# Patient Record
Sex: Female | Born: 1955 | Race: White | Hispanic: No | Marital: Married | Smoking: Never smoker
Health system: Southern US, Community
[De-identification: ages and names within clinical notes are randomized; demographics above are authoritative.]

---

## 2003-08-28 ENCOUNTER — Other Ambulatory Visit: Admission: RE | Admit: 2003-08-28 | Discharge: 2003-08-28 | Payer: Self-pay | Admitting: Obstetrics and Gynecology

## 2006-03-19 ENCOUNTER — Other Ambulatory Visit: Admission: RE | Admit: 2006-03-19 | Discharge: 2006-03-19 | Payer: Self-pay | Admitting: Family Medicine

## 2008-10-06 ENCOUNTER — Encounter: Admission: RE | Admit: 2008-10-06 | Discharge: 2008-10-06 | Payer: Self-pay | Admitting: Family Medicine

## 2008-12-07 ENCOUNTER — Ambulatory Visit (HOSPITAL_COMMUNITY): Admission: RE | Admit: 2008-12-07 | Discharge: 2008-12-07 | Payer: Self-pay | Admitting: Surgery

## 2008-12-07 ENCOUNTER — Encounter (INDEPENDENT_AMBULATORY_CARE_PROVIDER_SITE_OTHER): Payer: Self-pay | Admitting: Surgery

## 2009-05-09 ENCOUNTER — Other Ambulatory Visit: Admission: RE | Admit: 2009-05-09 | Discharge: 2009-05-09 | Payer: Self-pay | Admitting: Family Medicine

## 2010-06-28 LAB — CBC
HCT: 39.9 % (ref 36.0–46.0)
Hemoglobin: 13.4 g/dL (ref 12.0–15.0)
MCV: 98.8 fL (ref 78.0–100.0)
Platelets: 223 10*3/uL (ref 150–400)
RDW: 13 % (ref 11.5–15.5)

## 2010-06-28 LAB — DIFFERENTIAL
Basophils Absolute: 0 10*3/uL (ref 0.0–0.1)
Basophils Relative: 0 % (ref 0–1)
Lymphocytes Relative: 32 % (ref 12–46)
Neutro Abs: 2.9 10*3/uL (ref 1.7–7.7)
Neutrophils Relative %: 59 % (ref 43–77)

## 2010-06-28 LAB — COMPREHENSIVE METABOLIC PANEL
Alkaline Phosphatase: 39 U/L (ref 39–117)
BUN: 7 mg/dL (ref 6–23)
Chloride: 106 mEq/L (ref 96–112)
Creatinine, Ser: 0.61 mg/dL (ref 0.4–1.2)
Glucose, Bld: 82 mg/dL (ref 70–99)
Potassium: 4.6 mEq/L (ref 3.5–5.1)
Total Bilirubin: 0.5 mg/dL (ref 0.3–1.2)
Total Protein: 6.7 g/dL (ref 6.0–8.3)

## 2011-04-06 IMAGING — US US ABDOMEN COMPLETE
1 series · 13 of 25 positions shown · non-contrast
Comparison: None.

CLINICAL DATA: 52-year-old female with right upper quadrant and
epigastric abdominal pain.

COMPLETE ABDOMINAL ULTRASOUND

[Series 1: us abdomen complete · 0.22mm/px · 13 of 93 slices shown]
[im 1/93]
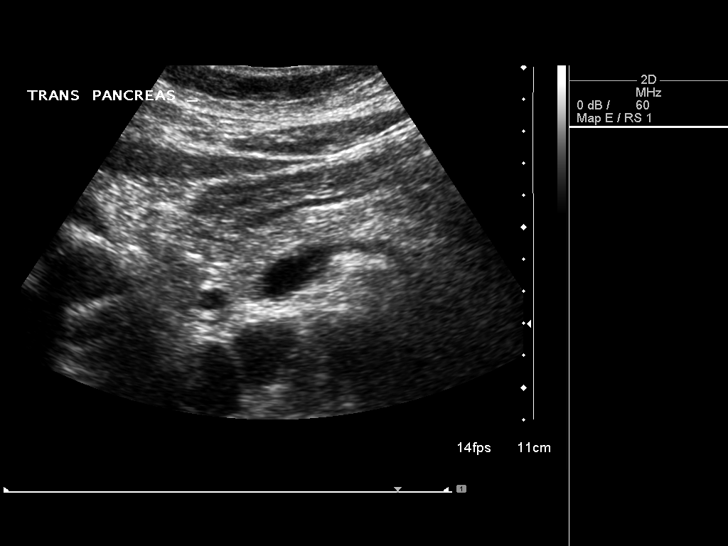
[im 8/93]
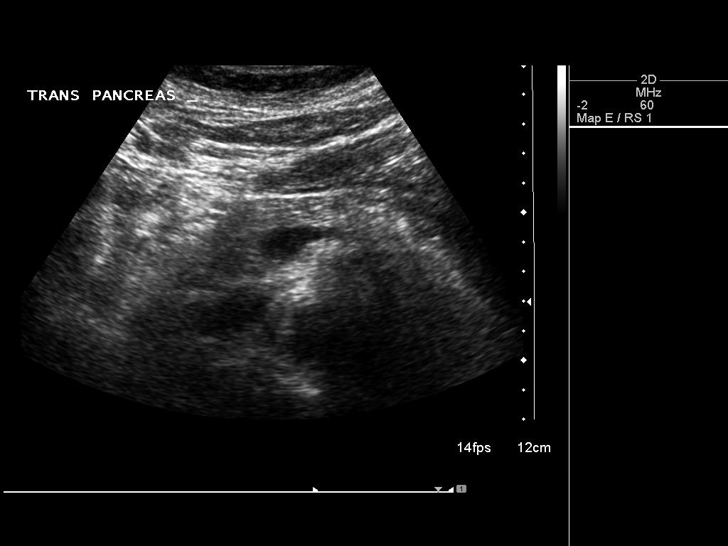
[im 16/93]
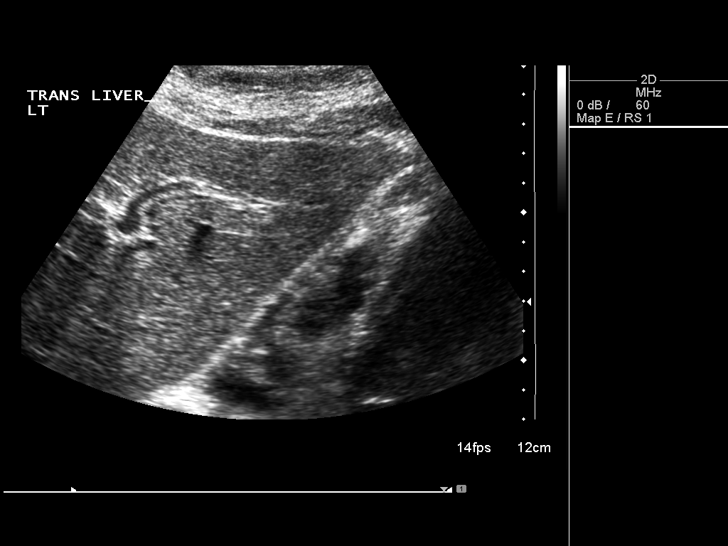
[im 24/93]
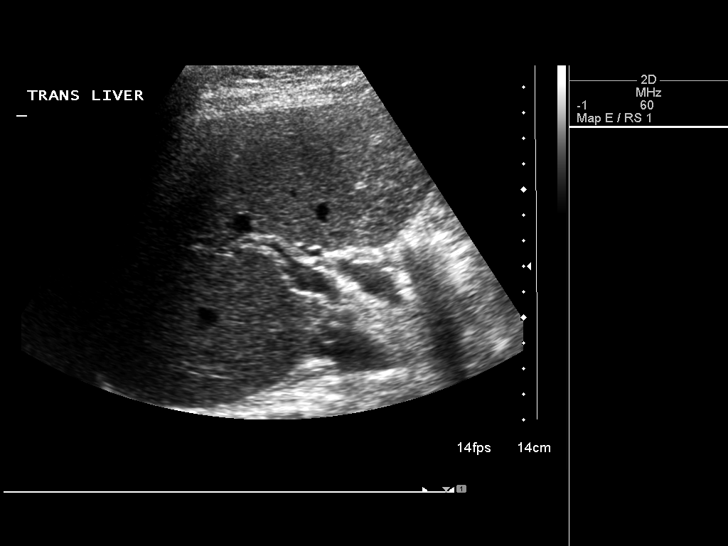
[im 31/93]
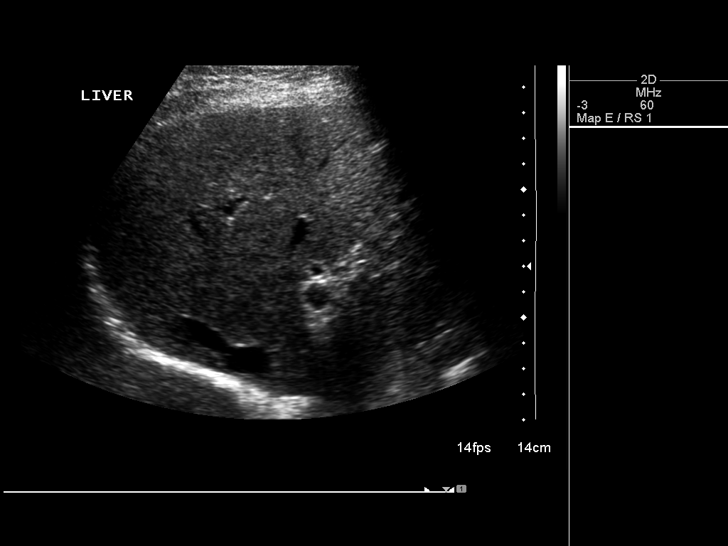
[im 39/93]
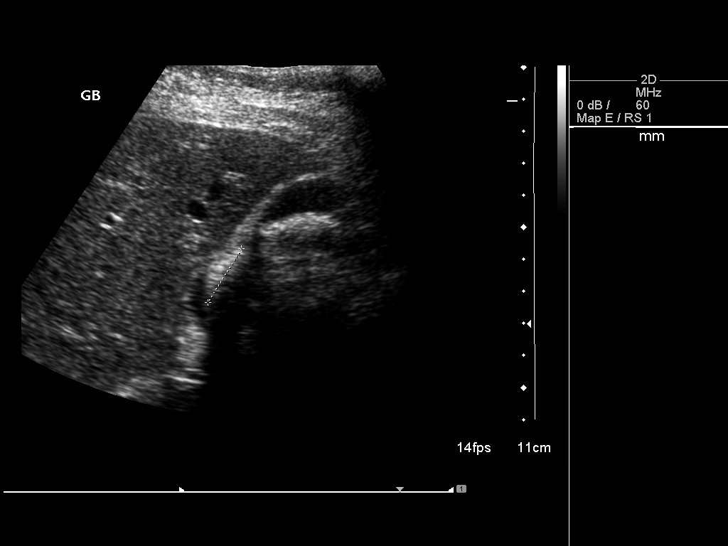
[im 47/93]
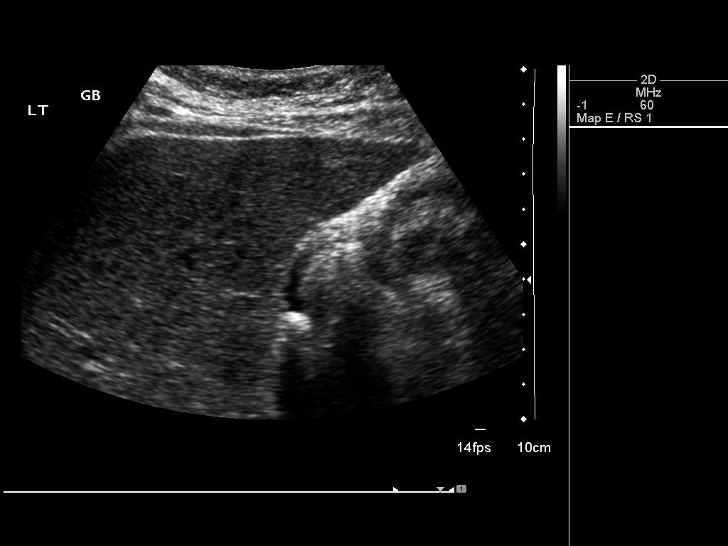
[im 54/93]
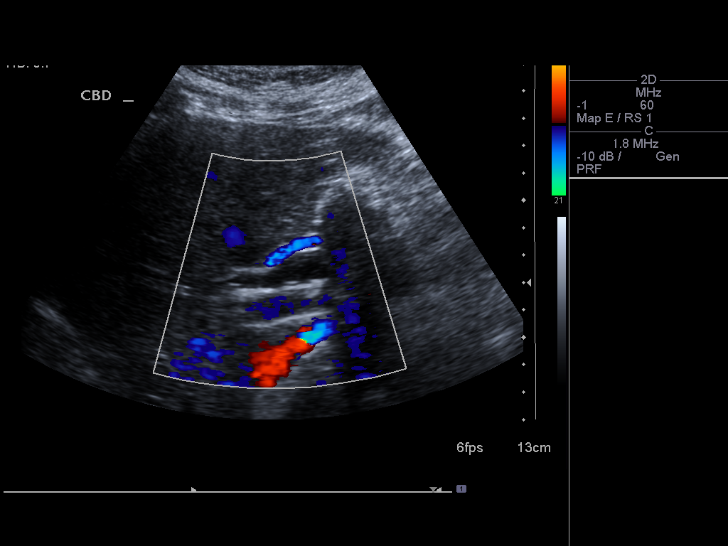
[im 62/93]
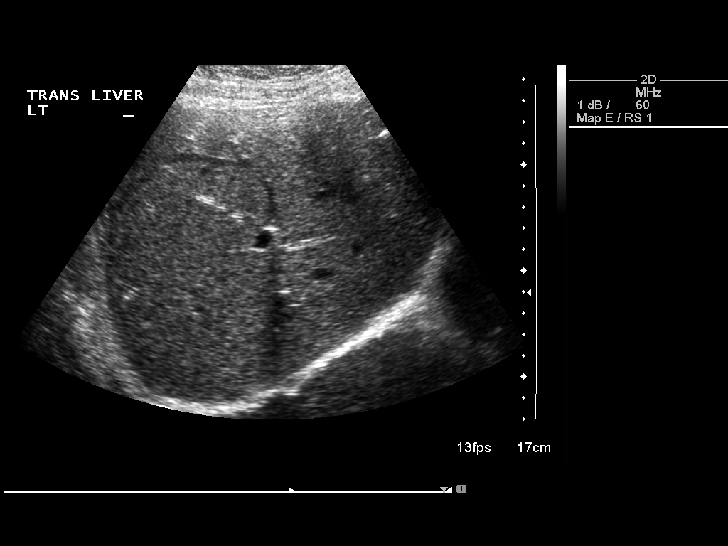
[im 70/93]
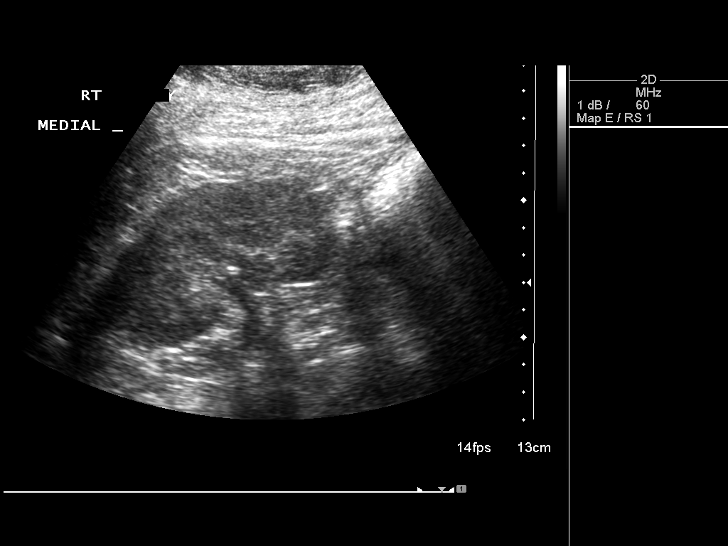
[im 77/93]
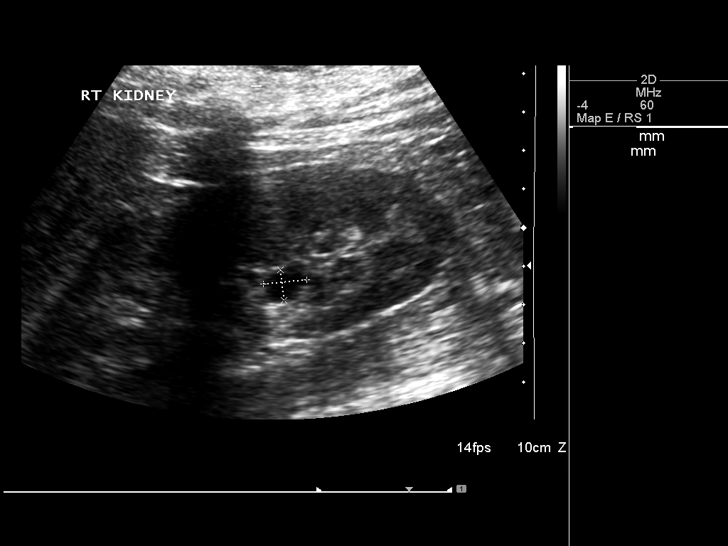
[im 85/93]
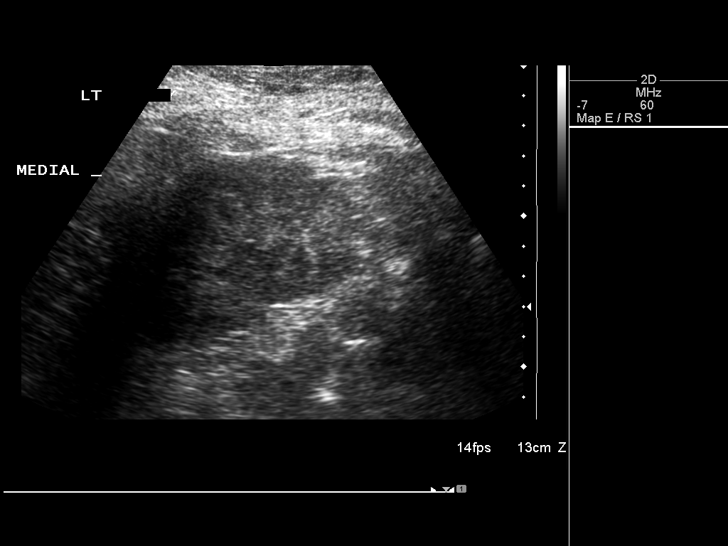
[im 93/93]
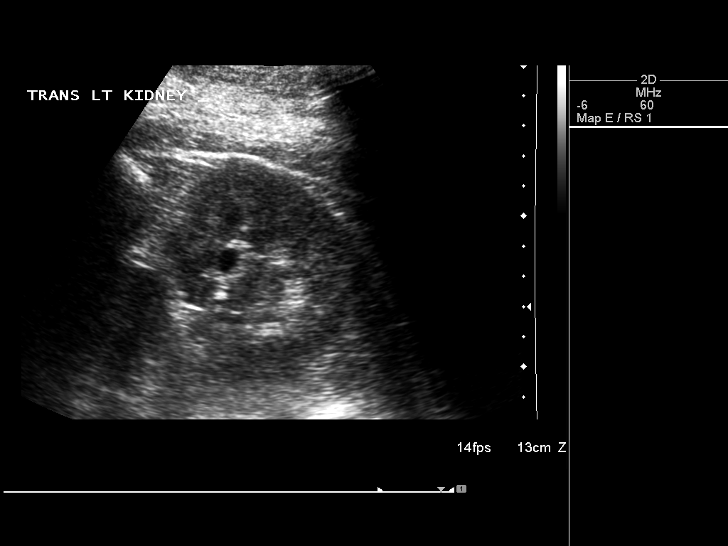

[13 of 25 positions shown; findings below may reference images not displayed]

FINDINGS: Gallbladder:  Multiple large shadowing gallstones measuring up to
approximately 29 mm in diameter.  Gallbladder wall thickness within
normal limits, 2-3 mm.  No sonographic Murphy's sign elicited.  No
pericholecystic fluid.

Common bile duct:  A segment of mild dilatation are noted, but the
duct does not appear universally dilated.  Ductal diameter of 5-10
mm is demonstrated.

Liver:  No intrahepatic biliary ductal dilatation.  Negative
sonographic appearance of the liver aside from occasional small
calcified granulomas.

IVC:  Negative.

Pancreas:  Negative.

Spleen:  Negative measuring 5.7 cm in length.

Right Kidney:  Negative aside from a small parapelvic cysts.  Right
renal length 11.8 cm.

Left Kidney:  Negative aside from small parapelvic and lower pole
renal cysts measuring up to 23 x 16 x 19 mm.  Left renal length
12.7 cm.

Abdominal aorta:  Negative with visualized maximal diameter 2.5 cm.
IMPRESSION: 1.  Cholelithiasis.  Clinical correlation for biliary colic
recommended.
2.  Questionable mild dilatation of the common bile duct without
intrahepatic biliary dilatation.  Correlation for
hyperbilirubinemia may be helpful.
3.  Benign appearing bilateral renal cysts.

I discussed the major findings in this case with Dr. Lakhedar by
telephone at 4444 hours on 10/06/2008.

## 2011-06-07 IMAGING — RF DG CHOLANGIOGRAM OPERATIVE
1 series · 4 of 4 positions shown · non-contrast
Comparison: None

CLINICAL DATA: Cholelithiasis

INTRAOPERATIVE CHOLANGIOGRAM
TECHNIQUE: Cholangiographic images from the C-arm fluoroscopic
device were submitted for interpretation post-operatively.  Please
see the procedural report for the amount of contrast and the
fluoroscopy time utilized.

[Series 1: run · 4 of 144 frames shown]
[frame 14/144]
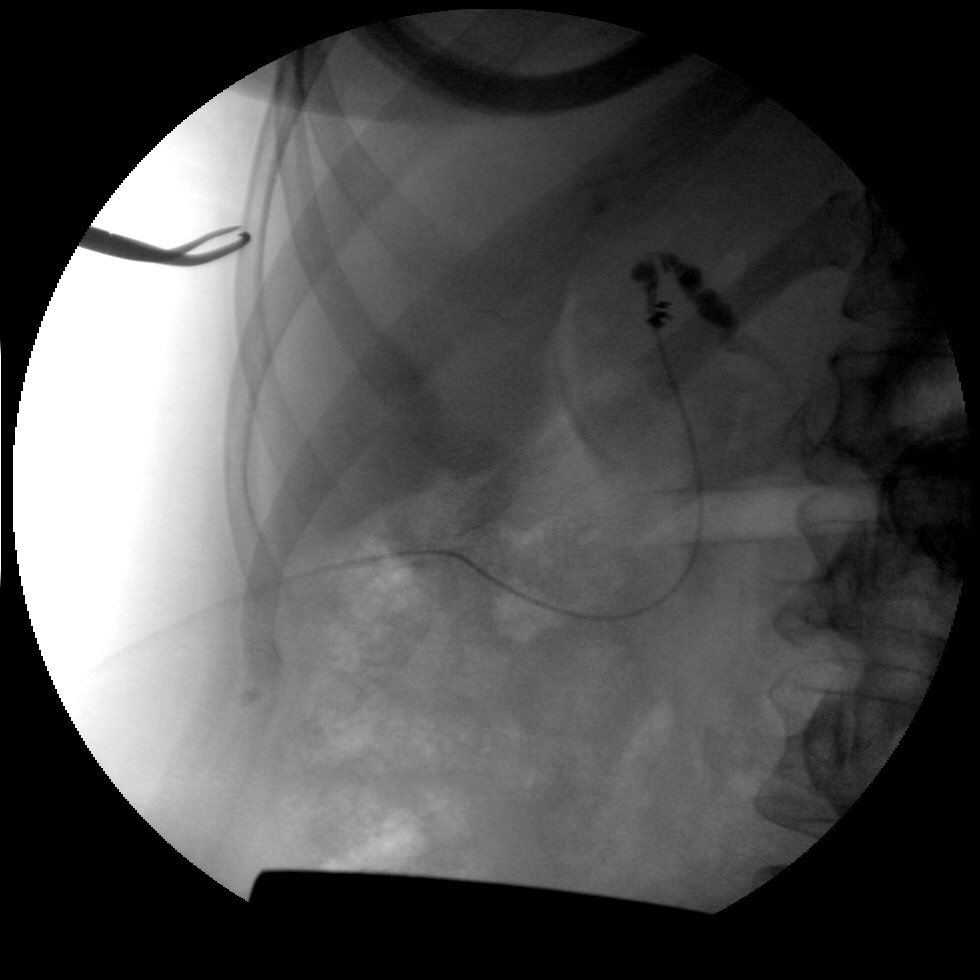
[frame 22/144]
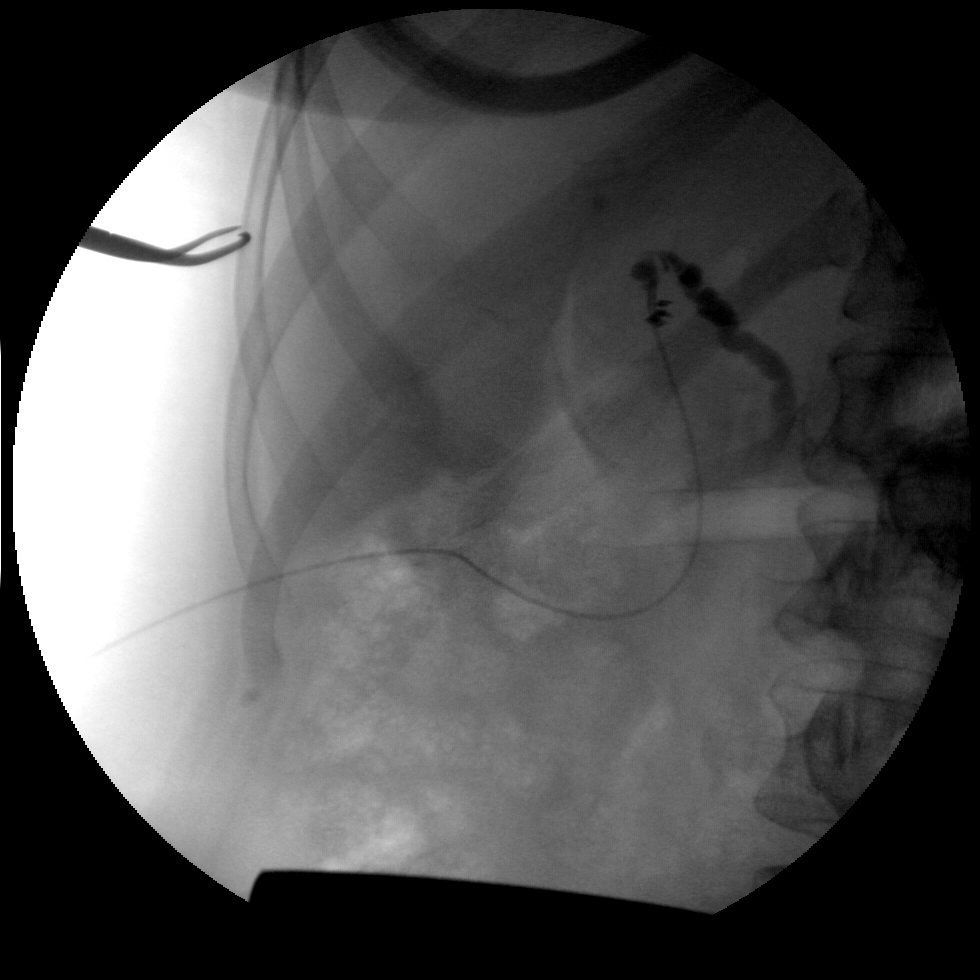
[frame 73/144]
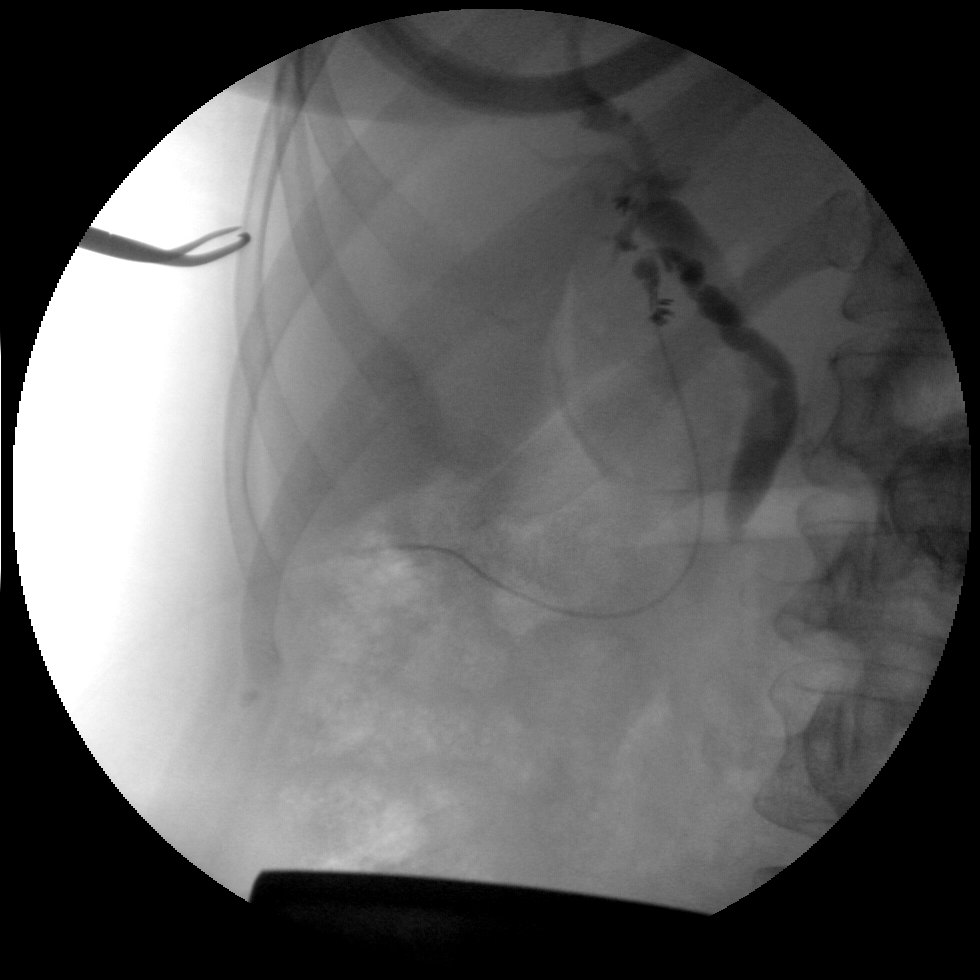
[frame 123/144]
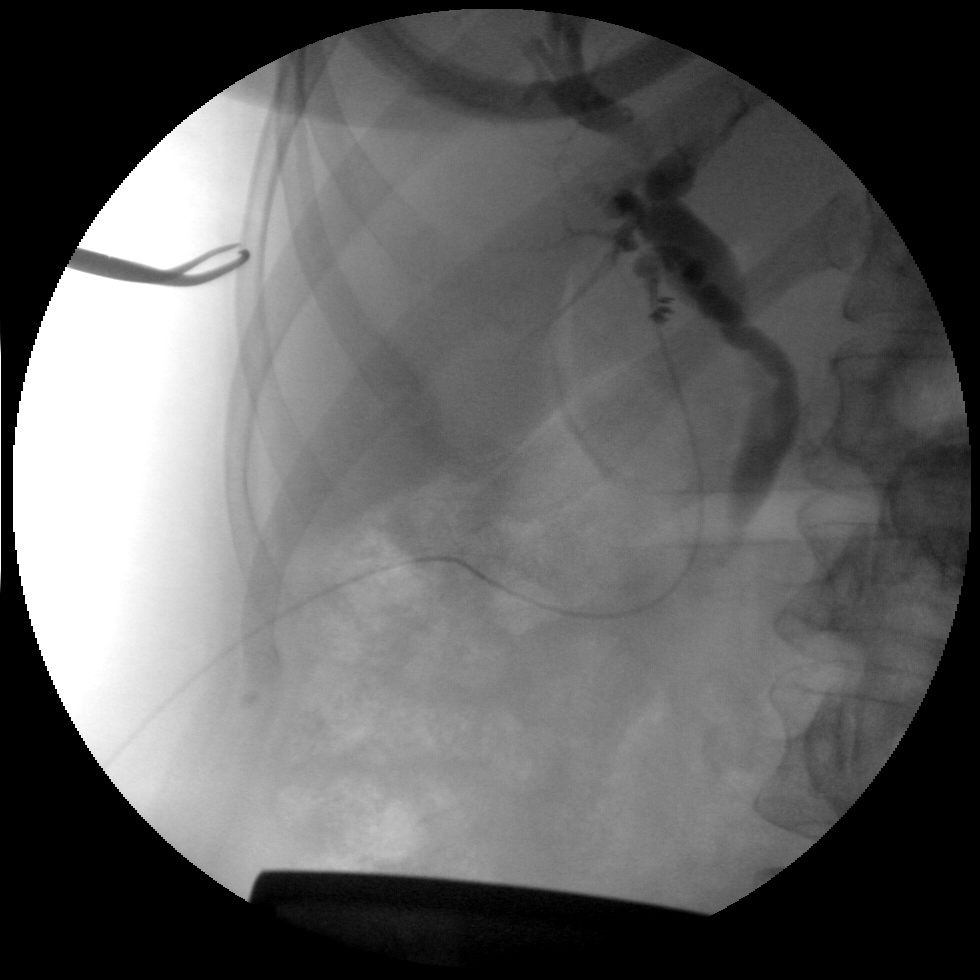

[4 of 4 positions shown; findings below may reference images not displayed]

FINDINGS: Intrahepatic ducts are incompletely visualized,
appearing decompressed centrally. No definite intraluminal filling
defect is identified.  However, contrast is not documented beyond
the ampulla into the duodenum, and I cannot entirely exclude a
distal obstructing calculus.

IMPRESSION

Possible distal obstructing common duct stone.

## 2012-05-27 ENCOUNTER — Other Ambulatory Visit: Payer: Self-pay | Admitting: Family Medicine

## 2012-05-27 ENCOUNTER — Other Ambulatory Visit (HOSPITAL_COMMUNITY)
Admission: RE | Admit: 2012-05-27 | Discharge: 2012-05-27 | Disposition: A | Discharge status: Home / Self Care | Payer: BC Managed Care – PPO | Source: Ambulatory Visit | Attending: Family Medicine | Admitting: Family Medicine

## 2012-05-27 DIAGNOSIS — Z124 Encounter for screening for malignant neoplasm of cervix: Secondary | ICD-10-CM | POA: Insufficient documentation

## 2015-06-26 ENCOUNTER — Other Ambulatory Visit (HOSPITAL_COMMUNITY)
Admission: RE | Admit: 2015-06-26 | Discharge: 2015-06-26 | Disposition: A | Discharge status: Home / Self Care | Source: Ambulatory Visit | Attending: Family Medicine | Admitting: Family Medicine

## 2015-06-26 ENCOUNTER — Other Ambulatory Visit: Payer: Self-pay | Admitting: Family Medicine

## 2015-06-26 DIAGNOSIS — Z124 Encounter for screening for malignant neoplasm of cervix: Secondary | ICD-10-CM | POA: Diagnosis not present

## 2015-06-27 LAB — CYTOLOGY - PAP

## 2019-08-29 ENCOUNTER — Other Ambulatory Visit (HOSPITAL_COMMUNITY)
Admission: RE | Admit: 2019-08-29 | Discharge: 2019-08-29 | Disposition: A | Discharge status: Home / Self Care | Source: Ambulatory Visit | Attending: Family Medicine | Admitting: Family Medicine

## 2019-08-29 ENCOUNTER — Other Ambulatory Visit: Payer: Self-pay | Admitting: Family Medicine

## 2019-08-29 DIAGNOSIS — Z124 Encounter for screening for malignant neoplasm of cervix: Secondary | ICD-10-CM | POA: Insufficient documentation

## 2019-08-31 LAB — CYTOLOGY - PAP: Diagnosis: NEGATIVE

## 2021-11-05 ENCOUNTER — Other Ambulatory Visit (HOSPITAL_BASED_OUTPATIENT_CLINIC_OR_DEPARTMENT_OTHER): Payer: Self-pay | Admitting: Family Medicine

## 2021-11-05 ENCOUNTER — Other Ambulatory Visit: Payer: Self-pay | Admitting: Family Medicine

## 2021-11-05 DIAGNOSIS — E7800 Pure hypercholesterolemia, unspecified: Secondary | ICD-10-CM

## 2021-11-15 ENCOUNTER — Ambulatory Visit (HOSPITAL_BASED_OUTPATIENT_CLINIC_OR_DEPARTMENT_OTHER)
Admission: RE | Admit: 2021-11-15 | Discharge: 2021-11-15 | Disposition: A | Discharge status: Home / Self Care | Payer: TRICARE For Life (TFL) | Source: Ambulatory Visit | Attending: Family Medicine | Admitting: Family Medicine

## 2021-11-15 DIAGNOSIS — E7800 Pure hypercholesterolemia, unspecified: Secondary | ICD-10-CM | POA: Insufficient documentation

## 2021-11-20 ENCOUNTER — Telehealth: Payer: Self-pay

## 2021-11-20 NOTE — Telephone Encounter (Signed)
NOTES SCANNED TO REFERRAL 

## 2021-11-21 NOTE — Progress Notes (Signed)
Cardiology Office Note:    Date:  11/22/2021   ID:  Evelyn Ponce, DOB Dec 26, 1955, MRN [id number]  PCP:  Daisy Floro, MD  Cardiologist:  None  Electrophysiologist:  None   Referring MD: Daisy Floro, MD   Chief Complaint  Patient presents with   Coronary Artery Disease    History of Present Illness:    Evelyn Ponce is a 66 y.o. female with hyperlipidemia who is referred by Dr Tenny Craw for evaluation of CAD.  Denies any chest pain, dyspnea, lightheadedness, syncope, or palpitations.  Reports occasional lower extremity edema.  States that she is active, works out on elliptical every day for 30 minutes, denies any exertional symptoms.  No smoking history.  Family history includes father had stroke at 74 and maternal grandfather died of MI at 9.  Calcium score on 11/15/2021 was 339 (93rd percentile).  Also with ascending aortic dilatation measuring 43 mm and small pericardial effusion.  No past medical history on file.    Current Medications: Current Meds  Medication Sig   aspirin EC 81 MG tablet Take 81 mg by mouth daily. Swallow whole.   rosuvastatin (CRESTOR) 10 MG tablet Take 1 tablet (10 mg total) by mouth daily.     Allergies:   Percocet [oxycodone-acetaminophen]   Social History   Socioeconomic History   Marital status: Married    Spouse name: Not on file   Number of children: Not on file   Years of education: Not on file   Highest education level: Not on file  Occupational History   Not on file  Tobacco Use   Smoking status: Never   Smokeless tobacco: Never  Substance and Sexual Activity   Alcohol use: Not on file   Drug use: Not on file   Sexual activity: Not on file  Other Topics Concern   Not on file  Social History Narrative   Not on file   Social Determinants of Health   Financial Resource Strain: Not on file  Food Insecurity: Not on file  Transportation Needs: Not on file  Physical Activity: Not on file  Stress: Not on file  Social  Connections: Not on file     Family History: Family history includes father had stroke at 69 and maternal grandfather died of MI at 19.  ROS:   Please see the history of present illness.     All other systems reviewed and are negative.  EKGs/Labs/Other Studies Reviewed:    The following studies were reviewed today:   EKG:   11/22/2021: Normal sinus rhythm, rate 70, no ST abnormalities  Recent Labs: No results found for requested labs within last 365 days.  Recent Lipid Panel No results found for: "CHOL", "TRIG", "HDL", "CHOLHDL", "VLDL", "LDLCALC", "LDLDIRECT"  Physical Exam:    VS:  BP 128/81 (BP Location: Left Arm, Patient Position: Sitting, Cuff Size: Normal)   Pulse 70   Ht 5\' 8"  (1.727 m)   Wt 177 lb 3.2 oz (80.4 kg)   SpO2 94%   BMI 26.94 kg/m     Wt Readings from Last 3 Encounters:  11/22/21 177 lb 3.2 oz (80.4 kg)     GEN:  Well nourished, well developed in no acute distress HEENT: Normal NECK: No JVD; No carotid bruits LYMPHATICS: No lymphadenopathy CARDIAC: RRR, no murmurs, rubs, gallops RESPIRATORY:  Clear to auscultation without rales, wheezing or rhonchi  ABDOMEN: Soft, non-tender, non-distended MUSCULOSKELETAL:  No edema; No deformity  SKIN: Warm and dry NEUROLOGIC:  Alert and oriented x 3 PSYCHIATRIC:  Normal affect   ASSESSMENT:    1. Coronary artery disease involving native coronary artery of native heart without angina pectoris   2. Hyperlipidemia, unspecified hyperlipidemia type   3. Aortic dilatation (HCC)   4. Pericardial effusion    PLAN:    CAD: Calcium score on 11/15/2021 was 339 (93rd percentile).  No anginal symptoms -Continue aspirin 81 mg daily -Start rosuvastatin 10 mg daily  Hyperlipidemia: LDL 143 on 10/25/2021.  Start rosuvastatin 10 mg daily as above.  Check fasting lipid panel in 2 months  Dilated ascending aorta: Measured 43 mm on calcium score.  Recommend CTA chest in 1 year to follow.  Check echocardiogram to evaluate for  aortic regurgitation  Pericardial effusion: Small effusion noted on calcium score.  Check echocardiogram   RTC in 6 months   Medication Adjustments/Labs and Tests Ordered: Current medicines are reviewed at length with the patient today.  Concerns regarding medicines are outlined above.  Orders Placed This Encounter  Procedures   CT ANGIO CHEST AORTA W/CM & OR WO/CM   Lipid panel   EKG 12-Lead   ECHOCARDIOGRAM COMPLETE   Meds ordered this encounter  Medications   rosuvastatin (CRESTOR) 10 MG tablet    Sig: Take 1 tablet (10 mg total) by mouth daily.    Dispense:  90 tablet    Refill:  3    Patient Instructions  Medication Instructions:  START rosuvastatin (Crestor) 10 mg daily  *If you need a refill on your cardiac medications before your next appointment, please call your pharmacy*   Lab Work: Please return for FASTING labs in 2 months (Lipid)  Our in office lab hours are Monday-Friday 8:00-4:00, closed for lunch 12:45-1:45 pm.  No appointment needed.  [facility] locations:   [facility] - [address] (Dr. Campbell Lerner office) - [address] ([facility]) - [address] [phone] N. [address]   [place] - [address]    [place]  - [address]    [place] - [address] - [address] [employer]  - [address] - [address] Chief Technology Officer)  Testing/Procedures: Your physician has requested that you have an echocardiogram. Echocardiography is a painless test that uses sound waves to create images of your heart. It provides your doctor with information about the size and shape of your heart and how well your heart's chambers and valves are working. This procedure takes approximately one hour. There are no restrictions for this procedure.  CTA chest/aorta in 12 months-we will schedule this closer to the time it is  due.  Follow-Up: At [facility], you and your health needs are our priority.  As part of our continuing mission to provide you with exceptional heart care, we have created designated Provider Care Teams.  These Care Teams include your primary Cardiologist (physician) and Advanced Practice Providers (APPs -  Physician Assistants and Nurse Practitioners) who all work together to provide you with the care you need, when you need it.  We recommend signing up for the patient portal called "MyChart".  Sign up information is provided on this After Visit Summary.  MyChart is used to connect with patients for Virtual Visits (Telemedicine).  Patients are able to view lab/test results, encounter notes, upcoming appointments, etc.  Non-urgent messages can be sent to your provider  as well.   To learn more about what you can do with MyChart, go to [email or web address].    Your next appointment:   6 month(s)  The format for your next appointment:   In Person  Provider:   Dr. Bjorn Pippin  Important Information About Sugar         Signed, Little Ishikawa, MD  11/22/2021 12:16 PM    [facility]

## 2021-11-22 ENCOUNTER — Ambulatory Visit: Payer: Medicare Other | Attending: Cardiology | Admitting: Cardiology

## 2021-11-22 ENCOUNTER — Encounter: Payer: Self-pay | Admitting: Cardiology

## 2021-11-22 VITALS — BP 128/81 | HR 70 | Ht 68.0 in | Wt 177.2 lb

## 2021-11-22 DIAGNOSIS — I2510 Atherosclerotic heart disease of native coronary artery without angina pectoris: Secondary | ICD-10-CM | POA: Diagnosis not present

## 2021-11-22 DIAGNOSIS — I77819 Aortic ectasia, unspecified site: Secondary | ICD-10-CM | POA: Diagnosis not present

## 2021-11-22 DIAGNOSIS — I3139 Other pericardial effusion (noninflammatory): Secondary | ICD-10-CM | POA: Insufficient documentation

## 2021-11-22 DIAGNOSIS — E785 Hyperlipidemia, unspecified: Secondary | ICD-10-CM | POA: Insufficient documentation

## 2021-11-22 MED ORDER — ROSUVASTATIN CALCIUM 10 MG PO TABS: 10.0000 mg | ORAL_TABLET | Freq: Every day | ORAL | 3 refills | Status: DC

## 2021-11-22 NOTE — Patient Instructions (Signed)
Medication Instructions:  START rosuvastatin (Crestor) 10 mg daily  *If you need a refill on your cardiac medications before your next appointment, please call your pharmacy*   Lab Work: Please return for FASTING labs in 2 months (Lipid)  Our in office lab hours are Monday-Friday 8:00-4:00, closed for lunch 12:45-1:45 pm.  No appointment needed.  [facility] locations:   [facility] - [address] (Dr. Campbell Lerner office) - [address] ([facility]) - [address] [phone] N. [address]    - [address]    [place]  - [address]    [place] - [address] - [address] [employer]  - [address] - [address] Chief Technology Officer)  Testing/Procedures: Your physician has requested that you have an echocardiogram. Echocardiography is a painless test that uses sound waves to create images of your heart. It provides your doctor with information about the size and shape of your heart and how well your heart's chambers and valves are working. This procedure takes approximately one hour. There are no restrictions for this procedure.  CTA chest/aorta in 12 months-we will schedule this closer to the time it is due.  Follow-Up: At [facility], you and your health needs are our priority.  As part of our continuing mission to provide you with exceptional heart care, we have created designated Provider Care Teams.  These Care Teams include your primary Cardiologist (physician) and Advanced Practice Providers (APPs -  Physician Assistants and Nurse Practitioners) who all work together to provide you with the care you need, when you need it.  We recommend signing up for the patient portal called "MyChart".  Sign up information is provided on this After Visit Summary.  MyChart is used to connect with patients for Virtual Visits  (Telemedicine).  Patients are able to view lab/test results, encounter notes, upcoming appointments, etc.  Non-urgent messages can be sent to your provider as well.   To learn more about what you can do with MyChart, go to [email or web address].    Your next appointment:   6 month(s)  The format for your next appointment:   In Person  Provider:   Dr. Bjorn Pippin  Important Information About Sugar

## 2021-12-05 ENCOUNTER — Ambulatory Visit (HOSPITAL_COMMUNITY): Payer: Medicare Other | Attending: Cardiology

## 2021-12-05 DIAGNOSIS — I3139 Other pericardial effusion (noninflammatory): Secondary | ICD-10-CM | POA: Diagnosis present

## 2021-12-05 DIAGNOSIS — I77819 Aortic ectasia, unspecified site: Secondary | ICD-10-CM

## 2021-12-05 LAB — ECHOCARDIOGRAM COMPLETE
Area-P 1/2: 3.66 cm2
S' Lateral: 3 cm

## 2022-01-31 LAB — LIPID PANEL
Chol/HDL Ratio: 2.4 ratio (ref 0.0–4.4)
Cholesterol, Total: 132 mg/dL (ref 100–199)
HDL: 56 mg/dL (ref >39)
LDL Chol Calc (NIH): 63 mg/dL (ref 0–99)
Triglycerides: 64 mg/dL (ref 0–149)
VLDL Cholesterol Cal: 13 mg/dL (ref 5–40)

## 2022-05-20 NOTE — Progress Notes (Unsigned)
Cardiology Office Note:    Date:  05/21/2022   ID:  [name], [name] May 07, 1955, MRN [id number]  PCP:  Lawerance Cruel, MD  Cardiologist:  None  Electrophysiologist:  None   Referring MD: Lawerance Cruel, MD   Chief Complaint  Patient presents with   Coronary Artery Disease    History of Present Illness:    Evelyn Ponce is a 67 y.o. female with hyperlipidemia who presents for follow-up.  She was referred by Dr Harrington Challenger for evaluation of CAD, initially seen 11/22/2021.  Denies any chest pain, dyspnea, lightheadedness, syncope, or palpitations.  Reports occasional lower extremity edema.  States that she is active, works out on elliptical every day for 30 minutes, denies any exertional symptoms.  No smoking history.  Family history includes father had stroke at 81 and maternal grandfather died of MI at 6.  Calcium score on 11/15/2021 was 339 (93rd percentile).  Also with ascending aortic dilatation measuring 43 mm and small pericardial effusion.  Echocardiogram 12/05/2021 showed EF 60 to 123456, grade 1 diastolic dysfunction, normal RV function, mild mitral regurgitation, mild aortic regurgitation, dilated aortic root measuring 42 mm, trivial pericardial effusion.  Since last clinic visit, she reports she is doing OK.  She walks 2 to 2.5 miles per day.  Reports she has been having heaviness in chest.  Reports she feels symptoms across her whole chest.  Has not noted relationship with exertion.  Reports some lightheadedness after she does a long bike ride but denies any syncope.  Reports intermittent lower extremity edema.   No past medical history on file.    Current Medications: Current Meds  Medication Sig   aspirin EC 81 MG tablet Take 81 mg by mouth daily. Swallow whole.   cycloSPORINE (RESTASIS) 0.05 % ophthalmic emulsion Place 1 drop into both eyes 2 (two) times daily.   rosuvastatin (CRESTOR) 10 MG tablet Take 1 tablet (10 mg total) by mouth daily.     Allergies:   Percocet  [oxycodone-acetaminophen]   Social History   Socioeconomic History   Marital status: Married    Spouse name: Not on file   Number of children: Not on file   Years of education: Not on file   Highest education level: Not on file  Occupational History   Not on file  Tobacco Use   Smoking status: Never   Smokeless tobacco: Never  Substance and Sexual Activity   Alcohol use: Not on file   Drug use: Not on file   Sexual activity: Not on file  Other Topics Concern   Not on file  Social History Narrative   Not on file   Social Determinants of Health   Financial Resource Strain: Not on file  Food Insecurity: Not on file  Transportation Needs: Not on file  Physical Activity: Not on file  Stress: Not on file  Social Connections: Not on file     Family History: Family history includes father had stroke at 77 and maternal grandfather died of MI at 54.  ROS:   Please see the history of present illness.     All other systems reviewed and are negative.  EKGs/Labs/Other Studies Reviewed:    The following studies were reviewed today:   EKG:   11/22/2021: Normal sinus rhythm, rate 70, no ST abnormalities 05/21/22: Normal sinus rhythm, rate 62, no ST abnormalities  Recent Labs: No results found for requested labs within last 365 days.  Recent Lipid Panel    Component Value  Date/Time   CHOL 132 01/31/2022 0817   TRIG 64 01/31/2022 0817   HDL 56 01/31/2022 0817   CHOLHDL 2.4 01/31/2022 0817   LDLCALC 63 01/31/2022 0817    Physical Exam:    VS:  BP 108/78   Pulse 62   Ht '5\' 8"'$  (1.727 m)   Wt 178 lb (80.7 kg)   BMI 27.06 kg/m     Wt Readings from Last 3 Encounters:  05/21/22 178 lb (80.7 kg)  11/22/21 177 lb 3.2 oz (80.4 kg)     GEN:  Well nourished, well developed in no acute distress HEENT: Normal NECK: No JVD; No carotid bruits LYMPHATICS: No lymphadenopathy CARDIAC: RRR, no murmurs, rubs, gallops RESPIRATORY:  Clear to auscultation without rales, wheezing or  rhonchi  ABDOMEN: Soft, non-tender, non-distended MUSCULOSKELETAL:  No edema; No deformity  SKIN: Warm and dry NEUROLOGIC:  Alert and oriented x 3 PSYCHIATRIC:  Normal affect   ASSESSMENT:    1. Coronary artery disease involving native coronary artery of native heart, unspecified whether angina present   2. Hyperlipidemia, unspecified hyperlipidemia type   3. Aortic dilatation (HCC)   4. Chest pain of uncertain etiology     PLAN:    CAD: Calcium score on 11/15/2021 was 339 (93rd percentile).  She is reporting atypical chest pain -Recommend stress PET to evaluate for ischemia -Continue aspirin 81 mg daily -Continue rosuvastatin 10 mg daily  Hyperlipidemia: LDL 143 on 10/25/2021.  Started rosuvastatin 10 mg daily, LDL improved to 63 on 01/31/2022  Dilated ascending aorta: Measured 43 mm on calcium score. 10/2021  Recommend CTA chest in 1 year to follow.  Mild AI on echo 11/2021.    Pericardial effusion: Small effusion noted on calcium score 10/2021.  Minimal effusion on echo 11/2021   RTC in 6 months  Shared Decision Making/Informed Consent The risks [chest pain, shortness of breath, cardiac arrhythmias, dizziness, blood pressure fluctuations, myocardial infarction, stroke/transient ischemic attack, nausea, vomiting, allergic reaction, radiation exposure, metallic taste sensation and life-threatening complications (estimated to be 1 in 10,000)], benefits (risk stratification, diagnosing coronary artery disease, treatment guidance) and alternatives of a cardiac PET stress test were discussed in detail with Ms. Goldberg and she agrees to proceed.    Medication Adjustments/Labs and Tests Ordered: Current medicines are reviewed at length with the patient today.  Concerns regarding medicines are outlined above.  Orders Placed This Encounter  Procedures   NM PET CT CARDIAC PERFUSION MULTI W/ABSOLUTE BLOODFLOW   Cardiac Stress Test: Informed Consent Details: Physician/Practitioner Attestation;  Transcribe to consent form and obtain patient signature   EKG 12-Lead   No orders of the defined types were placed in this encounter.   Patient Instructions  Medication Instructions:  Your physician recommends that you continue on your current medications as directed. Please refer to the Current Medication list given to you today.  *If you need a refill on your cardiac medications before your next appointment, please call your pharmacy*  Testing/Procedures: CARDIAC PET- Your physician has requested that you have a Cardiac Pet Stress Test. This testing is completed at [facility] ([place], [place] [address]). The schedulers will call you to get this scheduled. Please follow instructions below and call the office with any questions/concerns [phone]).  Follow-Up: At [facility], you and your health needs are our priority.  As part of our continuing mission to provide you with exceptional heart care, we have created designated Provider Care Teams.  These Care Teams include  your primary Cardiologist (physician) and Advanced Practice Providers (APPs -  Physician Assistants and Nurse Practitioners) who all work together to provide you with the care you need, when you need it.  We recommend signing up for the patient portal called "MyChart".  Sign up information is provided on this After Visit Summary.  MyChart is used to connect with patients for Virtual Visits (Telemedicine).  Patients are able to view lab/test results, encounter notes, upcoming appointments, etc.  Non-urgent messages can be sent to your provider as well.   To learn more about what you can do with MyChart, go to [email or web address].    Your next appointment:   6 month(s)  Provider:   Dr. Gardiner Rhyme Other Instructions How to Prepare for Your Cardiac PET/CT Stress Test:  1. Please do not take these medications before your test:   Medications that may interfere with the  cardiac pharmacological stress agent (ex. nitrates - including erectile dysfunction medications, isosorbide mononitrate, tamulosin or beta-blockers) the day of the exam. (Erectile dysfunction medication should be held for at least 72 hrs prior to test) Theophylline containing medications for 12 hours. Dipyridamole 48 hours prior to the test. Your remaining medications may be taken with water.  2. Nothing to eat or drink, except water, 3 hours prior to arrival time.   NO caffeine/decaffeinated products, or chocolate 12 hours prior to arrival.  3. NO perfume, cologne or lotion  4. Total time is 1 to 2 hours; you may want to bring reading material for the waiting time.  5. Please report to Radiology at the [facility] Main Entrance 30 minutes early for your test.  [place], [place] [address]  Diabetic Preparation:  Hold oral medications. You may take NPH and Lantus insulin. Do not take Humalog or Humulin R (Regular Insulin) the day of your test. Check blood sugars prior to leaving the house. If able to eat breakfast prior to 3 hour fasting, you may take all medications, including your insulin, Do not worry if you miss your breakfast dose of insulin - start at your next meal.  IF YOU THINK YOU MAY BE PREGNANT, OR ARE NURSING PLEASE INFORM THE TECHNOLOGIST.  In preparation for your appointment, medication and supplies will be purchased.  Appointment availability is limited, so if you need to cancel or reschedule, please call the Radiology Department at [phone]  24 hours in advance to avoid a cancellation fee of $100.00  What to Expect After you Arrive:  Once you arrive and check in for your appointment, you will be taken to a preparation room within the Radiology Department.  A technologist or Nurse will obtain your medical history, verify that you are correctly prepped for the exam, and explain the procedure.  Afterwards,  an IV will be started in your arm and  electrodes will be placed on your skin for EKG monitoring during the stress portion of the exam. Then you will be escorted to the PET/CT scanner.  There, staff will get you positioned on the scanner and obtain a blood pressure and EKG.  During the exam, you will continue to be connected to the EKG and blood pressure machines.  A small, safe amount of a radioactive tracer will be injected in your IV to obtain a series of pictures of your heart along with an injection of a stress agent.    After your Exam:  It is recommended that you eat a meal and drink a caffeinated beverage to counter act  any effects of the stress agent.  Drink plenty of fluids for the remainder of the day and urinate frequently for the first couple of hours after the exam.  Your doctor will inform you of your test results within 7-10 business days.  For questions about your test or how to prepare for your test, please call: Marchia Bond, Cardiac Imaging Nurse Navigator  Gordy Clement, Cardiac Imaging Nurse Navigator Office: [phone]     Signed, Donato Heinz, MD  05/21/2022 10:19 AM    [place]

## 2022-05-21 ENCOUNTER — Ambulatory Visit: Payer: Medicare Other | Attending: Cardiology | Admitting: Cardiology

## 2022-05-21 ENCOUNTER — Encounter: Payer: Self-pay | Admitting: Cardiology

## 2022-05-21 VITALS — BP 108/78 | HR 62 | Ht 68.0 in | Wt 178.0 lb

## 2022-05-21 DIAGNOSIS — R079 Chest pain, unspecified: Secondary | ICD-10-CM | POA: Insufficient documentation

## 2022-05-21 DIAGNOSIS — E785 Hyperlipidemia, unspecified: Secondary | ICD-10-CM | POA: Diagnosis present

## 2022-05-21 DIAGNOSIS — I77819 Aortic ectasia, unspecified site: Secondary | ICD-10-CM | POA: Diagnosis present

## 2022-05-21 DIAGNOSIS — I2510 Atherosclerotic heart disease of native coronary artery without angina pectoris: Secondary | ICD-10-CM | POA: Diagnosis not present

## 2022-05-21 NOTE — Addendum Note (Signed)
Addended by: Patria Mane A on: 05/21/2022 10:53 AM   Modules accepted: Orders

## 2022-05-21 NOTE — Patient Instructions (Signed)
Medication Instructions:  Your physician recommends that you continue on your current medications as directed. Please refer to the Current Medication list given to you today.  *If you need a refill on your cardiac medications before your next appointment, please call your pharmacy*  Testing/Procedures: CARDIAC PET- Your physician has requested that you have a Cardiac Pet Stress Test. This testing is completed at [facility] ([address], [place] [address]). The schedulers will call you to get this scheduled. Please follow instructions below and call the office with any questions/concerns [phone]).  Follow-Up: At [facility], you and your health needs are our priority.  As part of our continuing mission to provide you with exceptional heart care, we have created designated Provider Care Teams.  These Care Teams include your primary Cardiologist (physician) and Advanced Practice Providers (APPs -  Physician Assistants and Nurse Practitioners) who all work together to provide you with the care you need, when you need it.  We recommend signing up for the patient portal called "MyChart".  Sign up information is provided on this After Visit Summary.  MyChart is used to connect with patients for Virtual Visits (Telemedicine).  Patients are able to view lab/test results, encounter notes, upcoming appointments, etc.  Non-urgent messages can be sent to your provider as well.   To learn more about what you can do with MyChart, go to [email or web address].    Your next appointment:   6 month(s)  Provider:   Dr. Gardiner Rhyme Other Instructions How to Prepare for Your Cardiac PET/CT Stress Test:  1. Please do not take these medications before your test:   Medications that may interfere with the cardiac pharmacological stress agent (ex. nitrates - including erectile dysfunction medications, isosorbide mononitrate, tamulosin or beta-blockers) the day of the  exam. (Erectile dysfunction medication should be held for at least 72 hrs prior to test) Theophylline containing medications for 12 hours. Dipyridamole 48 hours prior to the test. Your remaining medications may be taken with water.  2. Nothing to eat or drink, except water, 3 hours prior to arrival time.   NO caffeine/decaffeinated products, or chocolate 12 hours prior to arrival.  3. NO perfume, cologne or lotion  4. Total time is 1 to 2 hours; you may want to bring reading material for the waiting time.  5. Please report to Radiology at the [facility] Main Entrance 30 minutes early for your test.  [place], [place] [address]  Diabetic Preparation:  Hold oral medications. You may take NPH and Lantus insulin. Do not take Humalog or Humulin R (Regular Insulin) the day of your test. Check blood sugars prior to leaving the house. If able to eat breakfast prior to 3 hour fasting, you may take all medications, including your insulin, Do not worry if you miss your breakfast dose of insulin - start at your next meal.  IF YOU THINK YOU MAY BE PREGNANT, OR ARE NURSING PLEASE INFORM THE TECHNOLOGIST.  In preparation for your appointment, medication and supplies will be purchased.  Appointment availability is limited, so if you need to cancel or reschedule, please call the Radiology Department at [phone]  24 hours in advance to avoid a cancellation fee of $100.00  What to Expect After you Arrive:  Once you arrive and check in for your appointment, you will be taken to a preparation room within the Radiology Department.  A technologist or Nurse will obtain your medical history, verify that you are  correctly prepped for the exam, and explain the procedure.  Afterwards,  an IV will be started in your arm and electrodes will be placed on your skin for EKG monitoring during the stress portion of the exam. Then you will be escorted to the PET/CT scanner.  There, staff  will get you positioned on the scanner and obtain a blood pressure and EKG.  During the exam, you will continue to be connected to the EKG and blood pressure machines.  A small, safe amount of a radioactive tracer will be injected in your IV to obtain a series of pictures of your heart along with an injection of a stress agent.    After your Exam:  It is recommended that you eat a meal and drink a caffeinated beverage to counter act any effects of the stress agent.  Drink plenty of fluids for the remainder of the day and urinate frequently for the first couple of hours after the exam.  Your doctor will inform you of your test results within 7-10 business days.  For questions about your test or how to prepare for your test, please call: Marchia Bond, Cardiac Imaging Nurse Navigator  Gordy Clement, Cardiac Imaging Nurse Navigator Office: [phone]

## 2022-05-23 ENCOUNTER — Telehealth (HOSPITAL_COMMUNITY): Payer: Self-pay | Admitting: Emergency Medicine

## 2022-05-23 NOTE — Telephone Encounter (Signed)
Reaching out to patient to offer assistance regarding upcoming cardiac imaging study; pt verbalizes understanding of appt date/time, parking situation and where to check in, pre-test NPO status and medications ordered, and verified current allergies; name and call back number provided for further questions should they arise Marchia Bond RN Navigator Cardiac Imaging [facility] [phone] office [phone] cell  Arrival [address]  Denies iv issues No food 3 h  No caffeine 12 h Daily meds per usual

## 2022-05-26 MED ORDER — REGADENOSON 0.4 MG/5ML IV SOLN: 0.4000 mg | Freq: Once | INTRAVENOUS | Status: DC

## 2022-05-26 MED ORDER — REGADENOSON 0.4 MG/5ML IV SOLN
0.4000 mg | Freq: Once | INTRAVENOUS | Status: AC
  Administered 2022-05-27: 0.4 mg via INTRAVENOUS

## 2022-05-27 ENCOUNTER — Encounter (HOSPITAL_COMMUNITY): Payer: Self-pay

## 2022-05-27 ENCOUNTER — Ambulatory Visit (HOSPITAL_COMMUNITY)
Admission: RE | Admit: 2022-05-27 | Discharge: 2022-05-27 | Disposition: A | Discharge status: Home / Self Care | Payer: Medicare Other | Source: Ambulatory Visit | Attending: Cardiology | Admitting: Cardiology

## 2022-05-27 DIAGNOSIS — R079 Chest pain, unspecified: Secondary | ICD-10-CM

## 2022-05-27 LAB — NM PET CT CARDIAC PERFUSION MULTI W/ABSOLUTE BLOODFLOW
MBFR: 3.86
Nuc Rest EF: 75 %
Nuc Stress EF: 82 %
Rest MBF: 0.79 ml/g/min
Rest Nuclear Isotope Dose: 20.6 mCi
ST Depression (mm): 0 mm
Stress MBF: 3.05 ml/g/min
Stress Nuclear Isotope Dose: 20.2 mCi
TID: 1

## 2022-05-27 MED ORDER — RUBIDIUM RB82 GENERATOR (RUBYFILL)
21.0000 | PACK | Freq: Once | INTRAVENOUS | Status: AC
  Administered 2022-05-27: 20.2 via INTRAVENOUS

## 2022-05-27 MED ORDER — RUBIDIUM RB82 GENERATOR (RUBYFILL)
21.0000 | PACK | Freq: Once | INTRAVENOUS | Status: AC
  Administered 2022-05-27: 20.6 via INTRAVENOUS

## 2022-05-27 NOTE — Progress Notes (Signed)
Tolerated test well

## 2022-06-03 MED FILL — Regadenoson IV Inj 0.4 MG/5ML (0.08 MG/ML): INTRAVENOUS | Qty: 5 | Status: AC

## 2022-08-21 ENCOUNTER — Other Ambulatory Visit: Payer: Self-pay | Admitting: Cardiology

## 2022-11-03 ENCOUNTER — Other Ambulatory Visit: Payer: Self-pay

## 2022-11-03 DIAGNOSIS — I77819 Aortic ectasia, unspecified site: Secondary | ICD-10-CM

## 2022-11-03 LAB — BASIC METABOLIC PANEL
BUN/Creatinine Ratio: 18 (ref 12–28)
BUN: 14 mg/dL (ref 8–27)
CO2: 27 mmol/L (ref 20–29)
Calcium: 9.5 mg/dL (ref 8.7–10.3)
Chloride: 107 mmol/L — ABNORMAL HIGH (ref 96–106)
Creatinine, Ser: 0.78 mg/dL (ref 0.57–1.00)
Glucose: 84 mg/dL (ref 70–99)
Potassium: 4.9 mmol/L (ref 3.5–5.2)
Sodium: 144 mmol/L (ref 134–144)
eGFR: 84 mL/min/{1.73_m2} (ref >59)

## 2022-11-10 ENCOUNTER — Ambulatory Visit (HOSPITAL_BASED_OUTPATIENT_CLINIC_OR_DEPARTMENT_OTHER)
Admission: RE | Admit: 2022-11-10 | Discharge: 2022-11-10 | Disposition: A | Discharge status: Home / Self Care | Payer: Medicare Other | Source: Ambulatory Visit | Attending: Cardiology | Admitting: Cardiology

## 2022-11-10 ENCOUNTER — Encounter (HOSPITAL_BASED_OUTPATIENT_CLINIC_OR_DEPARTMENT_OTHER): Payer: Self-pay

## 2022-11-10 DIAGNOSIS — I77819 Aortic ectasia, unspecified site: Secondary | ICD-10-CM | POA: Diagnosis present

## 2022-11-10 MED ORDER — IOHEXOL 350 MG/ML SOLN
100.0000 mL | Freq: Once | INTRAVENOUS | Status: AC | PRN
  Administered 2022-11-10: 75 mL via INTRAVENOUS

## 2022-11-14 ENCOUNTER — Other Ambulatory Visit: Payer: Self-pay

## 2022-11-14 DIAGNOSIS — I77819 Aortic ectasia, unspecified site: Secondary | ICD-10-CM

## 2022-11-24 NOTE — Progress Notes (Unsigned)
Cardiology Office Note:    Date:  11/27/2022   ID:  Evelyn, Ponce 05/07/1955, MRN [id number]  PCP:  Daisy Floro, MD  Cardiologist:  Little Ishikawa, MD  Electrophysiologist:  None   Referring MD: Daisy Floro, MD   Chief Complaint  Patient presents with   Coronary Artery Disease    History of Present Illness:    Evelyn Ponce is a 67 y.o. female with hyperlipidemia who presents for follow-up.  She was referred by Dr Tenny Craw for evaluation of CAD, initially seen 11/22/2021.  Denies any chest pain, dyspnea, lightheadedness, syncope, or palpitations.  Reports occasional lower extremity edema.  States that she is active, works out on elliptical every day for 30 minutes, denies any exertional symptoms.  No smoking history.  Family history includes father had stroke at 51 and maternal grandfather died of MI at 57.  Calcium score on 11/15/2021 was 339 (93rd percentile).  Also with ascending aortic dilatation measuring 43 mm and small pericardial effusion.  Echocardiogram 12/05/2021 showed EF 60 to 65%, grade 1 diastolic dysfunction, normal RV function, mild mitral regurgitation, mild aortic regurgitation, dilated aortic root measuring 42 mm, trivial pericardial effusion.  Stress PET on 05/27/2022 showed normal perfusion, EF 75%, normal myocardial blood flow reserve.  Since last clinic visit, she reports he is doing well.  Recently started on prednisone and hydroxychloroquine for rheumatoid arthritis.  Denies any chest pain, dyspnea, lightheadedness, syncope, lower extremity edema, or palpitations.  She either walks 3 miles per day or bikes 10 to 20 miles, sometimes both.  She denies any exertional symptoms.   No past medical history on file.    Current Medications: Current Meds  Medication Sig   aspirin EC 81 MG tablet Take 81 mg by mouth daily. Swallow whole.   cycloSPORINE (RESTASIS) 0.05 % ophthalmic emulsion Place 1 drop into both eyes 2 (two) times daily.    hydroxychloroquine (PLAQUENIL) 200 MG tablet Take 200 mg by mouth daily.   rosuvastatin (CRESTOR) 10 MG tablet TAKE 1 TABLET(10 MG) BY MOUTH DAILY     Allergies:   Percocet [oxycodone-acetaminophen]   Social History   Socioeconomic History   Marital status: Married    Spouse name: Not on file   Number of children: Not on file   Years of education: Not on file   Highest education level: Not on file  Occupational History   Not on file  Tobacco Use   Smoking status: Never   Smokeless tobacco: Never  Substance and Sexual Activity   Alcohol use: Not on file   Drug use: Not on file   Sexual activity: Not on file  Other Topics Concern   Not on file  Social History Narrative   Not on file   Social Determinants of Health   Financial Resource Strain: Not on file  Food Insecurity: Not on file  Transportation Needs: Not on file  Physical Activity: Not on file  Stress: Not on file  Social Connections: Not on file     Family History: Family history includes father had stroke at 49 and maternal grandfather died of MI at 23.  ROS:   Please see the history of present illness.     All other systems reviewed and are negative.  EKGs/Labs/Other Studies Reviewed:    The following studies were reviewed today:   EKG:   11/22/2021: Normal sinus rhythm, rate 70, no ST abnormalities 05/21/22: Normal sinus rhythm, rate 62, no ST abnormalities 11/26/2022: Normal sinus  rhythm, rate 60, no ST abnormalities  Recent Labs: 11/03/2022: BUN 14; Creatinine, Ser 0.78; Potassium 4.9; Sodium 144  Recent Lipid Panel    Component Value Date/Time   CHOL 132 01/31/2022 0817   TRIG 64 01/31/2022 0817   HDL 56 01/31/2022 0817   CHOLHDL 2.4 01/31/2022 0817   LDLCALC 63 01/31/2022 0817    Physical Exam:    VS:  BP 104/64   Pulse 60   Ht 5\' 8"  (1.727 m)   Wt 176 lb (79.8 kg)   SpO2 96%   BMI 26.76 kg/m     Wt Readings from Last 3 Encounters:  11/26/22 176 lb (79.8 kg)  05/21/22 178 lb (80.7  kg)  11/22/21 177 lb 3.2 oz (80.4 kg)     GEN:  Well nourished, well developed in no acute distress HEENT: Normal NECK: No JVD; No carotid bruits LYMPHATICS: No lymphadenopathy CARDIAC: RRR, no murmurs, rubs, gallops RESPIRATORY:  Clear to auscultation without rales, wheezing or rhonchi  ABDOMEN: Soft, non-tender, non-distended MUSCULOSKELETAL:  No edema; No deformity  SKIN: Warm and dry NEUROLOGIC:  Alert and oriented x 3 PSYCHIATRIC:  Normal affect   ASSESSMENT:    1. Coronary artery disease involving native coronary artery of native heart, unspecified whether angina present   2. Aortic dilatation (HCC)   3. Hyperlipidemia, unspecified hyperlipidemia type      PLAN:    CAD: Calcium score on 11/15/2021 was 339 (93rd percentile).  She reported atypical chest pain.  Stress PET on 05/27/2022 showed normal perfusion, EF 75%, normal myocardial blood flow reserve. -Continue aspirin 81 mg daily -Continue rosuvastatin 10 mg daily  Hyperlipidemia: LDL 143 on 10/25/2021.  Started rosuvastatin 10 mg daily, LDL improved to 63 on 01/31/2022  Dilated ascending aorta: Measured 43 mm on calcium score. 10/2021  Mild AI on echo 11/2021.  CTA chest 10/2022 showed ascending aorta measured 39 mm  Pericardial effusion: Small effusion noted on calcium score 10/2021.  Minimal effusion on echo 11/2021  Lung nodule: Noted on CT chest 10/2022, noncontrast chest CT recommended to follow in 1 year  RA: recently started on hydroxychloroquine by rheumatology   RTC in 1 year    Medication Adjustments/Labs and Tests Ordered: Current medicines are reviewed at length with the patient today.  Concerns regarding medicines are outlined above.  Orders Placed This Encounter  Procedures   EKG 12-Lead   No orders of the defined types were placed in this encounter.   Patient Instructions  Medication Instructions:  Your physician recommends that you continue on your current medications as directed. Please refer to  the Current Medication list given to you today.  *If you need a refill on your cardiac medications before your next appointment, please call your pharmacy*   Lab Work: None   Testing/Procedures: None   Follow-Up: At [facility], you and your health needs are our priority.  As part of our continuing mission to provide you with exceptional heart care, we have created designated Provider Care Teams.  These Care Teams include your primary Cardiologist (physician) and Advanced Practice Providers (APPs -  Physician Assistants and Nurse Practitioners) who all work together to provide you with the care you need, when you need it.   Your next appointment:   1 year(s)  Provider:   Little Ishikawa, MD    Signed, Little Ishikawa, MD  11/27/2022 10:28 PM    [facility]

## 2022-11-26 ENCOUNTER — Ambulatory Visit: Payer: Medicare Other | Attending: Cardiology | Admitting: Cardiology

## 2022-11-26 VITALS — BP 104/64 | HR 60 | Ht 68.0 in | Wt 176.0 lb

## 2022-11-26 DIAGNOSIS — I77819 Aortic ectasia, unspecified site: Secondary | ICD-10-CM | POA: Diagnosis present

## 2022-11-26 DIAGNOSIS — I2510 Atherosclerotic heart disease of native coronary artery without angina pectoris: Secondary | ICD-10-CM | POA: Diagnosis present

## 2022-11-26 DIAGNOSIS — E785 Hyperlipidemia, unspecified: Secondary | ICD-10-CM | POA: Diagnosis present

## 2022-11-26 NOTE — Patient Instructions (Addendum)
Medication Instructions:  Your physician recommends that you continue on your current medications as directed. Please refer to the Current Medication list given to you today.  *If you need a refill on your cardiac medications before your next appointment, please call your pharmacy*   Lab Work: None   Testing/Procedures: None   Follow-Up: At [facility], you and your health needs are our priority.  As part of our continuing mission to provide you with exceptional heart care, we have created designated Provider Care Teams.  These Care Teams include your primary Cardiologist (physician) and Advanced Practice Providers (APPs -  Physician Assistants and Nurse Practitioners) who all work together to provide you with the care you need, when you need it.   Your next appointment:   1 year(s)  Provider:   Little Ishikawa, MD

## 2023-08-06 ENCOUNTER — Other Ambulatory Visit: Payer: Self-pay | Admitting: Cardiology

## 2023-11-10 ENCOUNTER — Ambulatory Visit (HOSPITAL_BASED_OUTPATIENT_CLINIC_OR_DEPARTMENT_OTHER)
Admission: RE | Admit: 2023-11-10 | Discharge: 2023-11-10 | Disposition: A | Discharge status: Home / Self Care | Payer: Medicare Other | Source: Ambulatory Visit | Attending: Cardiology | Admitting: Cardiology

## 2023-11-10 DIAGNOSIS — I77819 Aortic ectasia, unspecified site: Secondary | ICD-10-CM | POA: Diagnosis present

## 2023-11-22 ENCOUNTER — Ambulatory Visit: Payer: Self-pay | Admitting: Cardiology

## 2023-11-22 DIAGNOSIS — I2510 Atherosclerotic heart disease of native coronary artery without angina pectoris: Secondary | ICD-10-CM

## 2023-11-22 DIAGNOSIS — I77819 Aortic ectasia, unspecified site: Secondary | ICD-10-CM

## 2023-11-29 NOTE — Progress Notes (Unsigned)
 Cardiology Office Note:    Date:  11/30/2023   ID:  Evelyn, Ponce 04/02/1955, MRN [id number]  PCP:  Evelyn Carlin Redbird, MD  Cardiologist:  Evelyn LITTIE Nanas, MD  Electrophysiologist:  None   Referring MD: Evelyn Carlin Redbird, MD   Chief Complaint  Patient presents with   Coronary Artery Disease    History of Present Illness:    Evelyn Ponce is a 68 y.o. female with hyperlipidemia who presents for follow-up.  She was referred by Dr Evelyn for evaluation of CAD, initially seen 11/22/2021.  Denies any chest pain, dyspnea, lightheadedness, syncope, or palpitations.  Reports occasional lower extremity edema.  States that she is active, works out on elliptical every day for 30 minutes, denies any exertional symptoms.  No smoking history.  Family history includes father had stroke at 23 and maternal grandfather died of MI at 46.  Calcium  score on 11/15/2021 was 339 (93rd percentile).  Also with ascending aortic dilatation measuring 43 mm and small pericardial effusion.  Echocardiogram 12/05/2021 showed EF 60 to 65%, grade 1 diastolic dysfunction, normal RV function, mild mitral regurgitation, mild aortic regurgitation, dilated aortic root measuring 42 mm, trivial pericardial effusion.  Stress PET on 05/27/2022 showed normal perfusion, EF 75%, normal myocardial blood flow reserve.  Since last clinic visit, she reports she is doing well.  Denies any chest pain, lightheadedness, syncope, lower extremity edema.  Reports occasional dyspnea.  She walks for 3 miles per day and also ride her bike 3 days/week for 20 miles, denies any exertional chest pain or dyspnea.  Reports occasional palpitations if lying on right side.  Reports BP in 110s at home.    No past medical history on file.    Current Medications: Current Meds  Medication Sig   aspirin EC 81 MG tablet Take 81 mg by mouth daily. Swallow whole.   folic acid (FOLVITE) 1 MG tablet 1 tablet Orally Once a day; Duration: 90 days    hydroxychloroquine (PLAQUENIL) 200 MG tablet Take 200 mg by mouth daily.   methotrexate (RHEUMATREX) 2.5 MG tablet TAKE 6 TABLETS BY MOUTH WEEKLY; Duration: 90 days   rosuvastatin  (CRESTOR ) 10 MG tablet TAKE 1 TABLET(10 MG) BY MOUTH DAILY     Allergies:   Percocet [oxycodone-acetaminophen]   Social History   Socioeconomic History   Marital status: Married    Spouse name: Not on file   Number of children: Not on file   Years of education: Not on file   Highest education level: Not on file  Occupational History   Not on file  Tobacco Use   Smoking status: Never   Smokeless tobacco: Never  Substance and Sexual Activity   Alcohol use: Not on file   Drug use: Not on file   Sexual activity: Not on file  Other Topics Concern   Not on file  Social History Narrative   Not on file   Social Drivers of Health   Financial Resource Strain: Not on file  Food Insecurity: Not on file  Transportation Needs: Not on file  Physical Activity: Not on file  Stress: Not on file  Social Connections: Not on file     Family History: Family history includes father had stroke at 70 and maternal grandfather died of MI at 56.  ROS:   Please see the history of present illness.     All other systems reviewed and are negative.  EKGs/Labs/Other Studies Reviewed:    The following studies were reviewed today:  EKG:   11/22/2021: Normal sinus rhythm, rate 70, no ST abnormalities 05/21/22: Normal sinus rhythm, rate 62, no ST abnormalities 11/26/2022: Normal sinus rhythm, rate 60, no ST abnormalities 11/30/2023: Normal sinus rhythm, rate 64, no ST abnormalities  Recent Labs: No results found for requested labs within last 365 days.  Recent Lipid Panel    Component Value Date/Time   CHOL 132 01/31/2022 0817   TRIG 64 01/31/2022 0817   HDL 56 01/31/2022 0817   CHOLHDL 2.4 01/31/2022 0817   LDLCALC 63 01/31/2022 0817    Physical Exam:    VS:  BP 120/74 (BP Location: Right Arm, Patient Position:  Sitting, Cuff Size: Normal)   Pulse 68   Ht 5' 8 (1.727 m)   Wt 175 lb (79.4 kg)   SpO2 99%   BMI 26.61 kg/m     Wt Readings from Last 3 Encounters:  11/30/23 175 lb (79.4 kg)  11/26/22 176 lb (79.8 kg)  05/21/22 178 lb (80.7 kg)     GEN:  Well nourished, well developed in no acute distress HEENT: Normal NECK: No JVD; No carotid bruits LYMPHATICS: No lymphadenopathy CARDIAC: RRR, no murmurs, rubs, gallops RESPIRATORY:  Clear to auscultation without rales, wheezing or rhonchi  ABDOMEN: Soft, non-tender, non-distended MUSCULOSKELETAL:  No edema; No deformity  SKIN: Warm and dry NEUROLOGIC:  Alert and oriented x 3 PSYCHIATRIC:  Normal affect   ASSESSMENT:    1. Coronary artery disease involving native coronary artery of native heart, unspecified whether angina present   2. Aortic dilatation (HCC)   3. Hyperlipidemia, unspecified hyperlipidemia type   4. Hydronephrosis, unspecified hydronephrosis type      PLAN:    CAD: Calcium  score on 11/15/2021 was 339 (93rd percentile).  She reported atypical chest pain.  Stress PET on 05/27/2022 showed normal perfusion, EF 75%, normal myocardial blood flow reserve. -Continue aspirin 81 mg daily -Continue rosuvastatin  10 mg daily  Hyperlipidemia: LDL 143 on 10/25/2021.  Started rosuvastatin  10 mg daily, LDL 49 on 12/2022  Dilated ascending aorta: Measured 43 mm on calcium  score. 10/2021  Mild AI on echo 11/2021.  CTA chest 10/2022 showed ascending aorta measured 39 mm.  Measured 42 mm on CT 10/2023 - Recommend echocardiogram in 1 year to monitor  Pericardial effusion: Small effusion noted on calcium  score 10/2021.  Minimal effusion on echo 11/2021  Lung nodule: Noted on CT chest 10/2022, repeat chest CT 10/2023 showed stable nodules, no follow-up recommended  RA: on hydroxychloroquine and methotrexate, followed by rheumatology  Possible left hydronephrosis versus parapelvic renal cyst: Noted on CT chest, dedicated renal ultrasound  recommended   RTC in 1 year    Medication Adjustments/Labs and Tests Ordered: Current medicines are reviewed at length with the patient today.  Concerns regarding medicines are outlined above.  Orders Placed This Encounter  Procedures   US  Renal   EKG 12-Lead   No orders of the defined types were placed in this encounter.   Patient Instructions  Medication Instructions:  Your physician recommends that you continue on your current medications as directed. Please refer to the Current Medication list given to you today.  *If you need a refill on your cardiac medications before your next appointment, please call your pharmacy*  Lab Work: none If you have labs (blood work) drawn today and your tests are completely normal, you will receive your results only by: MyChart Message (if you have MyChart) OR A paper copy in the mail If you have any lab test that is  abnormal or we need to change your treatment, we will call you to review the results.  Testing/Procedures: Your physician has requested that you have a renal artery duplex. During this test, an ultrasound is used to evaluate blood flow to the kidneys. Allow one hour for this exam. Do not eat after midnight the day before and avoid carbonated beverages. Take your medications as you usually do.   Follow-Up: At [facility], you and your health needs are our priority.  As part of our continuing mission to provide you with exceptional heart care, our providers are all part of one team.  This team includes your primary Cardiologist (physician) and Advanced Practice Providers or APPs (Physician Assistants and Nurse Practitioners) who all work together to provide you with the care you need, when you need it.  Your next appointment:   1 year  Provider:   Dr.Luis Nickles  We recommend signing up for the patient portal called MyChart.  Sign up information is provided on this After Visit Summary.  MyChart is used to connect with  patients for Virtual Visits (Telemedicine).  Patients are able to view lab/test results, encounter notes, upcoming appointments, etc.  Non-urgent messages can be sent to your provider as well.   To learn more about what you can do with MyChart, go to [email or web address].   Other Instructions        Signed, Evelyn LITTIE Nanas, MD  11/30/2023 9:07 AM    [facility]

## 2023-11-30 ENCOUNTER — Ambulatory Visit: Attending: Cardiology | Admitting: Cardiology

## 2023-11-30 VITALS — BP 120/74 | HR 68 | Ht 68.0 in | Wt 175.0 lb

## 2023-11-30 DIAGNOSIS — N1330 Unspecified hydronephrosis: Secondary | ICD-10-CM | POA: Diagnosis present

## 2023-11-30 DIAGNOSIS — I77819 Aortic ectasia, unspecified site: Secondary | ICD-10-CM | POA: Insufficient documentation

## 2023-11-30 DIAGNOSIS — E785 Hyperlipidemia, unspecified: Secondary | ICD-10-CM | POA: Diagnosis present

## 2023-11-30 DIAGNOSIS — I2510 Atherosclerotic heart disease of native coronary artery without angina pectoris: Secondary | ICD-10-CM | POA: Diagnosis not present

## 2023-11-30 NOTE — Patient Instructions (Addendum)
 Medication Instructions:  Your physician recommends that you continue on your current medications as directed. Please refer to the Current Medication list given to you today.  *If you need a refill on your cardiac medications before your next appointment, please call your pharmacy*  Lab Work: none If you have labs (blood work) drawn today and your tests are completely normal, you will receive your results only by: MyChart Message (if you have MyChart) OR A paper copy in the mail If you have any lab test that is abnormal or we need to change your treatment, we will call you to review the results.  Testing/Procedures: Your physician has requested that you have a renal artery duplex. During this test, an ultrasound is used to evaluate blood flow to the kidneys. Allow one hour for this exam. Do not eat after midnight the day before and avoid carbonated beverages. Take your medications as you usually do.   Follow-Up: At [facility], you and your health needs are our priority.  As part of our continuing mission to provide you with exceptional heart care, our providers are all part of one team.  This team includes your primary Cardiologist (physician) and Advanced Practice Providers or APPs (Physician Assistants and Nurse Practitioners) who all work together to provide you with the care you need, when you need it.  Your next appointment:   1 year  Provider:   Dr.Schumann  We recommend signing up for the patient portal called MyChart.  Sign up information is provided on this After Visit Summary.  MyChart is used to connect with patients for Virtual Visits (Telemedicine).  Patients are able to view lab/test results, encounter notes, upcoming appointments, etc.  Non-urgent messages can be sent to your provider as well.   To learn more about what you can do with MyChart, go to [email or web address].   Other Instructions

## 2023-12-09 ENCOUNTER — Ambulatory Visit (HOSPITAL_COMMUNITY)
Admission: RE | Admit: 2023-12-09 | Discharge: 2023-12-09 | Disposition: A | Discharge status: Home / Self Care | Source: Ambulatory Visit | Attending: Cardiology | Admitting: Cardiology

## 2023-12-09 DIAGNOSIS — N1330 Unspecified hydronephrosis: Secondary | ICD-10-CM | POA: Insufficient documentation

## 2023-12-13 ENCOUNTER — Ambulatory Visit: Payer: Self-pay | Admitting: Cardiology

## 2024-02-15 ENCOUNTER — Other Ambulatory Visit: Payer: Self-pay | Admitting: Cardiology
